# Patient Record
Sex: Male | Born: 1950 | Race: White | Hispanic: No | Smoking: Former smoker
Health system: Southern US, Community
[De-identification: ages and names within clinical notes are randomized; demographics above are authoritative.]

## PROBLEM LIST (undated history)

## (undated) DIAGNOSIS — I10 Essential (primary) hypertension: Secondary | ICD-10-CM

## (undated) DIAGNOSIS — I509 Heart failure, unspecified: Secondary | ICD-10-CM

## (undated) DIAGNOSIS — Z86711 Personal history of pulmonary embolism: Secondary | ICD-10-CM

## (undated) DIAGNOSIS — Z8614 Personal history of Methicillin resistant Staphylococcus aureus infection: Secondary | ICD-10-CM

## (undated) DIAGNOSIS — Z8673 Personal history of transient ischemic attack (TIA), and cerebral infarction without residual deficits: Secondary | ICD-10-CM

## (undated) DIAGNOSIS — Z7401 Bed confinement status: Secondary | ICD-10-CM

## (undated) DIAGNOSIS — E2740 Unspecified adrenocortical insufficiency: Secondary | ICD-10-CM

## (undated) DIAGNOSIS — F0390 Unspecified dementia without behavioral disturbance: Secondary | ICD-10-CM

## (undated) DIAGNOSIS — M069 Rheumatoid arthritis, unspecified: Secondary | ICD-10-CM

## (undated) DIAGNOSIS — Z87898 Personal history of other specified conditions: Secondary | ICD-10-CM

## (undated) DIAGNOSIS — M8580 Other specified disorders of bone density and structure, unspecified site: Secondary | ICD-10-CM

## (undated) DIAGNOSIS — I2510 Atherosclerotic heart disease of native coronary artery without angina pectoris: Secondary | ICD-10-CM

## (undated) DIAGNOSIS — N390 Urinary tract infection, site not specified: Secondary | ICD-10-CM

## (undated) DIAGNOSIS — N179 Acute kidney failure, unspecified: Secondary | ICD-10-CM

## (undated) DIAGNOSIS — G629 Polyneuropathy, unspecified: Secondary | ICD-10-CM

## (undated) HISTORY — DX: Urinary tract infection, site not specified: N39.0

## (undated) HISTORY — DX: Essential (primary) hypertension: I10

## (undated) HISTORY — DX: Rheumatoid arthritis, unspecified: M06.9

## (undated) HISTORY — DX: Personal history of other specified conditions: Z87.898

## (undated) HISTORY — DX: Unspecified adrenocortical insufficiency: E27.40

## (undated) HISTORY — DX: Unspecified dementia, unspecified severity, without behavioral disturbance, psychotic disturbance, mood disturbance, and anxiety: F03.90

## (undated) HISTORY — DX: Personal history of Methicillin resistant Staphylococcus aureus infection: Z86.14

## (undated) HISTORY — DX: Heart failure, unspecified: I50.9

## (undated) HISTORY — DX: Atherosclerotic heart disease of native coronary artery without angina pectoris: I25.10

## (undated) HISTORY — DX: Polyneuropathy, unspecified: G62.9

## (undated) HISTORY — DX: Bed confinement status: Z74.01

## (undated) HISTORY — DX: Other specified disorders of bone density and structure, unspecified site: M85.80

## (undated) HISTORY — DX: Acute kidney failure, unspecified: N17.9

## (undated) HISTORY — DX: Personal history of pulmonary embolism: Z86.711

## (undated) HISTORY — DX: Personal history of transient ischemic attack (TIA), and cerebral infarction without residual deficits: Z86.73

---

## 2008-01-05 HISTORY — PX: CORONARY ARTERY BYPASS GRAFT: SHX141

## 2020-06-11 ENCOUNTER — Other Ambulatory Visit (HOSPITAL_COMMUNITY): Payer: Self-pay | Admitting: Orthopedic Surgery

## 2020-06-11 DIAGNOSIS — M8008XA Age-related osteoporosis with current pathological fracture, vertebra(e), initial encounter for fracture: Secondary | ICD-10-CM

## 2020-06-13 ENCOUNTER — Ambulatory Visit (HOSPITAL_COMMUNITY)
Admission: RE | Admit: 2020-06-13 | Discharge: 2020-06-13 | Disposition: A | Discharge status: Home / Self Care | Payer: Medicare HMO | Source: Ambulatory Visit | Attending: Orthopedic Surgery | Admitting: Orthopedic Surgery

## 2020-06-13 ENCOUNTER — Other Ambulatory Visit: Payer: Self-pay

## 2020-06-13 DIAGNOSIS — M8008XA Age-related osteoporosis with current pathological fracture, vertebra(e), initial encounter for fracture: Secondary | ICD-10-CM | POA: Diagnosis not present

## 2022-01-12 ENCOUNTER — Telehealth (HOSPITAL_COMMUNITY): Payer: Self-pay

## 2022-01-18 ENCOUNTER — Other Ambulatory Visit (HOSPITAL_COMMUNITY): Payer: Self-pay | Admitting: Dentistry

## 2022-02-23 DIAGNOSIS — Z95828 Presence of other vascular implants and grafts: Secondary | ICD-10-CM

## 2022-02-23 HISTORY — DX: Presence of other vascular implants and grafts: Z95.828

## 2022-03-04 ENCOUNTER — Encounter: Payer: Self-pay | Admitting: Family Medicine

## 2022-03-04 ENCOUNTER — Non-Acute Institutional Stay: Payer: Medicare HMO | Admitting: Family Medicine

## 2022-03-04 ENCOUNTER — Other Ambulatory Visit: Payer: Self-pay | Admitting: Family Medicine

## 2022-03-04 VITALS — BP 110/60 | HR 106 | Temp 99.5°F | Resp 23

## 2022-03-04 DIAGNOSIS — Z7401 Bed confinement status: Secondary | ICD-10-CM

## 2022-03-04 DIAGNOSIS — R6889 Other general symptoms and signs: Secondary | ICD-10-CM

## 2022-03-04 DIAGNOSIS — Z8673 Personal history of transient ischemic attack (TIA), and cerebral infarction without residual deficits: Secondary | ICD-10-CM

## 2022-03-04 DIAGNOSIS — I10 Essential (primary) hypertension: Secondary | ICD-10-CM

## 2022-03-04 DIAGNOSIS — F322 Major depressive disorder, single episode, severe without psychotic features: Secondary | ICD-10-CM

## 2022-03-04 DIAGNOSIS — E2740 Unspecified adrenocortical insufficiency: Secondary | ICD-10-CM

## 2022-03-04 DIAGNOSIS — Z8619 Personal history of other infectious and parasitic diseases: Secondary | ICD-10-CM

## 2022-03-04 DIAGNOSIS — E440 Moderate protein-calorie malnutrition: Secondary | ICD-10-CM

## 2022-03-04 DIAGNOSIS — K683 Retroperitoneal hematoma: Secondary | ICD-10-CM

## 2022-03-04 DIAGNOSIS — Z87898 Personal history of other specified conditions: Secondary | ICD-10-CM

## 2022-03-04 NOTE — Progress Notes (Signed)
[facility] Consult Note Telephone: [phone]  Fax: [phone]   Date of encounter: 03/04/22 09:20 AM PATIENT NAME: Douglas Arroyo [address] [place] [address]   [phone] (home)  DOB: [birth year] MRN: [id number] PRIMARY CARE PROVIDER:    Javier Glazier, MD,  [place] [address] [phone]  REFERRING PROVIDER:   Javier Glazier, MD [address] [place],  [place] [address] [phone]  Health Care Agent:    Contact Information     Name Relation Home Work Mobile   Douglas Arroyo Daughter   [phone]        I met face to face with patient in [facility] facility.  Spoke with pt's daughter Douglas Arroyo by phone in multidisciplinary conference. Palliative Care was asked to follow this patient by consultation request of Douglas Glazier, MD to address advance care planning and complex medical decision making. This is an initial visit.  ADVANCE CARE PLANNING/GOALS OF CARE: Health care surrogate gave her permission to discuss.   PRESENT FOR DISCUSSION: daughter, Douglas Arroyo.  ACC provider-Douglas Sanks FNP-C, Douglas Arroyo multidisciplinary team including nurse and social worker  GOALS: Maintain maximal function and nutrition with comfort.  BARRIERS: Possible understanding of progression of disease process and normalcy of declining food and fluid intake towards end of life. Patient with depressive symptoms which may be impacting intake.   Our advance care planning conversation included a discussion about:    The value and importance of advance care planning  Discussed that poor intake of food and fluids along with recent bleeding, new onset of elevated temperature and tachycardia, tachypnea with some decline in O2 sat could indicate recurrent or unresolved infection or bleeding to see how aggressively she wanted to treat pt and if she wanted pt to return to hospital versus investigating hospice referral.   She advised that pt had tube feeding during hospitalization and she is interested to identify the source of his symptoms, possibly re-investigate tube feeding if needed. Advised that although appetite stimulant can be ordered Discussed current presentation and discussion with daughter with facility NP-Douglas Arroyo.  Advised psych evaluation with depressive symptoms.  He is currently on Prednisone for RA  and Remeron which should  help with appetite stimulation. She will order labs and follow up on patient. Review of an existing advance directive document-DNR. Daughter does wish DNR to remain in place.  CODE STATUS: DNR  I spent 30  minutes providing this consultation with more than 50% of the time spent on counseling patient and coordinating communication with referring provider, staff/family, chart review and documentation. -------------------------------------- Section below based on medical decision making (MDM).  ASSESSMENT/PLAN: Hx of septic shock: Currently with low grade temp, tachycardia and tachypnea but with normal BP-argues against shock, question if either new infection or unresolved infection. Work up per facility NP. Currently daughter wants to continue to address potential fixable causes. DNR. Question of possible oncologic process given recurrent PE while on anticoagulation, elevation of LFTs (although both could also be related to sepsis process).  2. Severe major depression/total self care deficit Recommend psychiatric evaluation and treatment. Continue Remeron 15 mg QHS. Was getting OOB in December, now significantly weaker since most recent illness and bleeding.  Has not wanted to attempt getting OOB. Needs help with feeding but not interested in eating.  3.  Malnutrition of moderate degree Agree with Ensure TID Last Albumin low at 2.7. Poor food and fluid intake. Question how much is illness burden versus  depressive symptoms Has not significantly improved with addition of  Remeron or with continued Prednisone. Question if progressing towards end of life behaviors.  4.  Adrenal insufficiency Blood sugars and blood pressures have stabilized with addition of Fludrocortisone daily, continue med.  5.  Retroperitoneal hematoma Significant ecchymosis.  Current low normal BP argues against significant bleeding as cause of tachycardia and tachypnea. Question if possible DIC process in hospital with significant re-bleeds and hemorrhages even after stopping anticoagulation as well as a secondary from PE/sepsis-unable to see if PT/INR or d-dimer obtained.  6. Bedbound Generalized weakness and residual post stroke hemiplegia. Recommend addition of low air loss pressure mattress to prevent skin breakdown over bony prominences. Turn q 2-3 hours while awake.   HISTORY OF PRESENT ILLNESS: Douglas Arroyo is a 72 y.o. year old male with Adrenal insufficiency, recent septic shock, PE, UTI and failure to thrive.  He has had multiple hospitalizations at [facility] since November of 2023 when he was initially treated for a MRSA + UTI. He was treated with IC Ceftriaxone and d/c'd on oral Nitrofurantoin.  He has a prior hx of DVT/PE 2020 and was on Eliquis.  In January (01/23/22) he was inpatient for UTI treated with Cefepime and Vancomycin. Plan was to d/c with foley, given he was admitted from SNF with foley. He does have a hx of RA and was on Plaquenil 200 mg BID and Prednisone 5 mg BID. Has chronic back pain with hx of old T12, L1 fracture for which he was taking Norco 5-325 mg nightly and Q 6 hrs prn.  He was continued on Eliquis 5 mg BID.  He developed a PE on 01/26/22 and noted to be in shock with profound hypotension and multiple episodes of hypoglycemia in non diabetic patient.  He was started on anticoagulation and developed issues with bleeding.  Multiple attempts were made to hold anticoagulants and as soon as these were restarted the bleeding resumed. He was subsequently noted to have  Adrenal insufficiency, cellulitis and UTI.  He had to have IVC filter placed and had to have dialysis due to episode of AKI with Cr 3.41, BUN 51. Calcium was low at 8.3, Phos at 1.5 with elevated AST 61 and alk phos of 228.  On 02/08/22 his Bps were documented as 60s/30s to 90s/60s with O2 sats in low 80s.  Prior to d/c BP in the 120/60s range with O2 sats 96-98%. Pt offers no spontaneous conversation, primarily answers yes or no.  States no appetite. Denies pain, SOB or cough.  Pt was bedbound prior to most recent hospitalization.  Endorses feeling down, depressed and hopeless but does not elaborate further.    Labs done at facility 02/25/22: CMP remarkable for high CL 112, alkaline phos 214.  Low Ca 8.5, Albumin 2.7. CBC remarkable for low HGB 8.2, HCT 27.6%.  Normal 294   ACTIVITIES OF DAILY LIVING: CONTINENT OF BLADDER? No CONTINENT OF BOWEL? No BATHING/DRESSING/FEEDING-needs assistance  MOBILITY:   AMBULATORY?  No, bedbound per staff since December 2023   BEDBOUND? Yes  APPETITE? Poor, mainly drinking part of 1 Ensure daily  CURRENT PROBLEM LIST:  Patient Active Problem List   Diagnosis Date Noted   Total self-care deficit 03/04/2022   History of stroke with right sided hemiplegia 03/04/2022   Hx of shock 01/26/22 03/04/2022   Hypertension 03/04/2022   Bedbound 03/04/2022   Adrenal insufficiency ([place]) 03/04/2022   Retroperitoneal hematoma 03/04/2022   PAST MEDICAL HISTORY:  Active Ambulatory Problems  Diagnosis Date Noted   Total self-care deficit 03/04/2022   History of stroke with right sided hemiplegia 03/04/2022   Hx of shock 01/26/22 03/04/2022   Hypertension 03/04/2022   Bedbound 03/04/2022   Adrenal insufficiency ([place]) 03/04/2022   Retroperitoneal hematoma 03/04/2022   Resolved Ambulatory Problems    Diagnosis Date Noted   No Resolved Ambulatory Problems   Past Medical History:  Diagnosis Date   AKI (acute kidney injury) ([place])    CAD (coronary artery  disease)    CHF (congestive heart failure) (HCC)    Dementia ([place])    History of pulmonary embolism 01/26/22    Neuropathy    Osteopenia    Personal history of MRSA (methicillin resistant Staphylococcus aureus)    Polyneuropathy    Rheumatoid arthritis ([place])    S/P IVC filter 02/23/2022   UTI (urinary tract infection)    SOCIAL HX:  Social History   Tobacco Use   Smoking status: Not on file   Smokeless tobacco: Former  Substance Use Topics   Alcohol use: Not Currently   FAMILY HX: No family history on file.     Preferred Pharmacy: [facility]:  Allergies  Allergen Reactions   Eliquis [Apixaban] Other (See Comments)    Hemorrhage   Enbrel [Etanercept]    Erythromycin      PERTINENT MEDICATIONS:  Outpatient Encounter Medications as of 03/04/2022  Medication Sig   donepezil (ARICEPT) 5 MG tablet Take 5 mg by mouth at bedtime.   fludrocortisone (FLORINEF) 0.1 MG tablet Take 0.1 mg by mouth daily. Daily at 0900 am   gabapentin (NEURONTIN) 100 MG capsule Take 100 mg by mouth 3 (three) times daily.   hydroxychloroquine (PLAQUENIL) 200 MG tablet Take 200 mg by mouth 2 (two) times daily.   ipratropium-albuterol (DUONEB) 0.5-2.5 (3) MG/3ML SOLN Take 3 mLs by nebulization every 6 (six) hours as needed. Shortness of breath, wheezing   MELATONIN PO Take 5 mg by mouth at bedtime.   mirtazapine (REMERON) 15 MG tablet Take 15 mg by mouth at bedtime.   predniSONE (DELTASONE) 5 MG tablet Take 10 mg by mouth 2 (two) times daily with a meal.   [DISCONTINUED] acetaminophen (TYLENOL) 325 MG tablet Take 650 mg by mouth every 4 (four) hours as needed for mild pain or fever.   No facility-administered encounter medications on file as of 03/04/2022.    History obtained from review of EMR, discussion with primary team, and interview with family, facility staff and/or patient.   01/22/22: CMP remarkable for low:  Na 130, CL 96, CO2 20, Ca 8.4, total protein 5.8, Albumin 2.9. High:  Total Bili 1.3,  Alk Phos 116. eGFR normal at 75, Cr at 1.07.  CBC: Unremarkable except for elevated abs monocyte and eosinophils.  Normal:  WBC 9.6, HGB 15.7, HCT 46.2%, PLT 246.  SARS-CoV-2, Influenza A & B were all negative Elevated lactic acid 2.4 CXR low lung volumes. No acute cardiopulmonary findings.  UA:  Spec grav 1.026, Protein 100, glucose 30, leuk esterase 25, RBCs 13/hpf  02/23/22 Labs at [facility]: BMP with normal Cr 1.16, K+ 3.8, Phos 3.1, Na 142. Low Calcium 8.2.  Elevated CL 112, BUN 29  CTA 02/09/22: Left retroperitoneal hematoma, s/p embolization of left lumbar and iliolumbar arteries.  Transfused with 3 units PRBCs and 1 unit whole blood with HGB stable at 8.2.  Eliquis permanently d/c'd due to recurrent bleeds.  AKI-Cr peaked at 4.46, required IHD during hospitalization to return to baseline function.  ST evaluation showed  no clear source of dysphagia but reported 4 month hx of sporadic coughing and choking with oral intake. Short term [place] placement with tube feedings inpatient. Psych consult thought related to vascular dementia and adjustment disorder with depressed mood. Discussed with pt and daughter who are against PEG.  CT head code stroke during admission for slurred speech and left pupil dilatation was unremarkable but suspected toxicity to Cefepime.  I reviewed available labs, medications, imaging, studies and related documents from the EMR. Records reviewed and summarized above.   Physical Exam: weight 234 lbs on 02/24/22 at hospital GENERAL: NAD, appears fatigued LUNGS: CTAB, no increased work of breathing, mild tachypnea, room air CARDIAC:  S1S2, Intermittently IRIR with slightly rapid rate 106 with no MRG, No edema, No  cyanosis ABD:  Hypo-active BS x 4 quads, soft, non-tender EXTREMITIES: Right sided hemiplegia, generalized debility, No muscle atrophy/subcutaneous fat loss Heme: Widespread dark purple ecchymosis of right arm circumferentially from just inferior to  shoulder to hand, right side of neck and submandibular area and upper chest inferior to right clavicle.  Fading red-purple ecchymosis/hematoma of low back and left flank. Palpable but soft, non-tender per pt.  NEURO:  Noted generalized  weakness, noted cognitive impairment, question aphasia-expressive/receptive versus choosing not to speak.  PERRL. No facial drooping. PSYCH:  non-anxious, flat affect, A & O to self.  Poor eye contact, gives one word answers, doesn't volunteer speech  Thank you for the opportunity to participate in the care of Douglas Arroyo. Please call our main office at [phone] if we can be of additional assistance.    Damaris Hippo FNP-C  [email or web address] [facility]  Phone:  [phone]

## 2022-03-06 ENCOUNTER — Encounter: Payer: Self-pay | Admitting: Family Medicine

## 2022-03-06 DIAGNOSIS — Z8619 Personal history of other infectious and parasitic diseases: Secondary | ICD-10-CM | POA: Insufficient documentation

## 2022-03-06 DIAGNOSIS — E440 Moderate protein-calorie malnutrition: Secondary | ICD-10-CM | POA: Insufficient documentation

## 2022-03-06 DIAGNOSIS — F322 Major depressive disorder, single episode, severe without psychotic features: Secondary | ICD-10-CM | POA: Insufficient documentation

## 2022-03-18 ENCOUNTER — Non-Acute Institutional Stay: Payer: Medicare HMO | Admitting: Family Medicine

## 2022-03-18 ENCOUNTER — Encounter: Payer: Self-pay | Admitting: Family Medicine

## 2022-03-18 VITALS — BP 98/74 | HR 106 | Temp 97.3°F | Resp 18

## 2022-03-18 DIAGNOSIS — R5381 Other malaise: Secondary | ICD-10-CM

## 2022-03-18 DIAGNOSIS — R609 Edema, unspecified: Secondary | ICD-10-CM | POA: Insufficient documentation

## 2022-03-18 DIAGNOSIS — E440 Moderate protein-calorie malnutrition: Secondary | ICD-10-CM

## 2022-03-18 DIAGNOSIS — F322 Major depressive disorder, single episode, severe without psychotic features: Secondary | ICD-10-CM

## 2022-03-18 NOTE — Progress Notes (Signed)
[facility] Consult Note Telephone: [phone]  Fax: [phone]   Date of encounter: 03/18/22 09:20 AM PATIENT NAME: Douglas Arroyo [address] [place] [address]   [phone] (home)  DOB: August 04, 1950 MRN: [id number] PRIMARY CARE PROVIDER:    Javier Glazier, MD,  [place] [address] [phone]  REFERRING PROVIDER:   Javier Glazier, MD [address] [place],   [address] [phone]  Health Care Agent:    Contact Information     Name Relation Home Work Mobile   Lane,[name] Daughter   [phone]        I met face to face with patient in [facility] facility.  Spoke with pt's daughter [name] by phone following visit. Palliative Care was asked to follow this patient by consultation request of Javier Glazier, MD to address advance care planning and complex medical decision making. This is a follow up visit.   CODE STATUS: DNR   Visit based on medical decision making (MDM).  ASSESSMENT/PLAN: 1. Severe major depression/total self care deficit Increase Sertraline from 25 to 50 mg daily. Encourage pt to get OOB TID with meals. Offer choices instead of asking: EX-Do you want to get up before or after breakfast?  2.  Malnutrition of moderate degree Agree with Ensure TID Albumin improved to 3.0  3.   RLE localized edema Discussed with facility PA and daughter. Possible DVT but pt unable to tolerate anticoagulation with significant bleeding, has IVC filter. Reposition Q 2 hrs while awake and elevate LE while in bed. OOB TID.  4.  Debility Recommend coordinated effort to facilitate pt getting OOB at least TID for meals by having PT, daughter, nurse Alda Berthold and provider get pt up to take a shower and make his bed.    HISTORY OF PRESENT ILLNESS: Douglas Arroyo is a 72 y.o. year old male with Adrenal insufficiency, recent septic shock, PE, UTI and failure to thrive.  He has had multiple  hospitalizations at [facility] since November of 2023 when he was initially treated for a MRSA + UTI. He has a prior hx of DVT/PE 2020 and was on Eliquis.  In January (01/23/22) he was inpatient for UTI treated with Cefepime and Vancomycin. He was taken off Aricept for unknown reason.  He denies pain, SOB or chest pain.  Acknowledges some itching. Limited conversation but answers questions appropriately with head nods and much prompting. Unable to state date or time, but does provide limited answers to personal history. Patient states that he is not turning routinely and has not been bathed recently.  Spoke with daughter [name] and nursing facility staff who state pt has been refusing to get OOB or shower.  [name] says Alda Berthold is the nurse who has been successful in getting pt into shower once weekly. Refused to continue working with PT. He had been transferring himself at one point and re-injured his back so pain management specialist indicated no further independent transfer. Pt is very afraid when staff using lift to get him OOB and has since refused.  Daughter states pt had a cat who he loved and they were unable to keep, live-in girlfriend of 21 years took care of pt for first year post stroke and then left him stating she couldn't do it any more. He has thus suffered multiple losses. Spoke with [place] the PA for facility and advised of RLE discrepancy in edema with question of possible DVT but since no anticoagulation is possible,  will continue to monitor and reposition for comfort. Discussed increasing recent Zoloft from 25 to 50 mg daily and she was in agreement. He likes to eat baked goods and doing coffee but he has showed no interest recently. She had tried multiple things to get him out of the bed including a motorized wheelchair but he refuses to use it.   Labs done at facility 03/04/22: CMP remarkable for high  alkaline phos 176.  Low  Albumin 3.0. CBC remarkable for low HGB 10.3, HCT 32.7%.   WBC normal  at 10.1 with left shift elevated ANC 7858.  ACTIVITIES OF DAILY LIVING: CONTINENT OF BLADDER/BOWEL? Yes with incontinent episodes   BATHING/DRESSING/FEEDING-needs assistance  MOBILITY:   AMBULATORY?  No, bedbound per staff since December 2023 , able to assist with bed mobility   BEDBOUND? Yes  APPETITE? Poor, staff report only intaking Ensure and OJ   CURRENT PROBLEM LIST:  Patient Active Problem List   Diagnosis Date Noted   Hx of septic shock 03/06/2022   Depression, major, single episode, severe ([place]) 03/06/2022   Malnutrition of moderate degree ([place]) 03/06/2022   Total self-care deficit 03/04/2022   History of stroke with right sided hemiplegia 03/04/2022   Hx of shock 01/26/22 03/04/2022   Hypertension 03/04/2022   Bedbound 03/04/2022   Adrenal insufficiency ([place]) 03/04/2022   Retroperitoneal hematoma 03/04/2022   PAST MEDICAL HISTORY:  Active Ambulatory Problems    Diagnosis Date Noted   Total self-care deficit 03/04/2022   History of stroke with right sided hemiplegia 03/04/2022   Hx of shock 01/26/22 03/04/2022   Hypertension 03/04/2022   Bedbound 03/04/2022   Adrenal insufficiency ([place]) 03/04/2022   Retroperitoneal hematoma 03/04/2022   Hx of septic shock 03/06/2022   Depression, major, single episode, severe ([place]) 03/06/2022   Malnutrition of moderate degree ([place]) 03/06/2022   Resolved Ambulatory Problems    Diagnosis Date Noted   No Resolved Ambulatory Problems   Past Medical History:  Diagnosis Date   AKI (acute kidney injury) ([place])    CAD (coronary artery disease)    CHF (congestive heart failure) (HCC)    Dementia ([place])    History of pulmonary embolism 01/26/22    Neuropathy    Osteopenia    Personal history of MRSA (methicillin resistant Staphylococcus aureus)    Polyneuropathy    Rheumatoid arthritis ([place])    S/P IVC filter 02/23/2022   UTI (urinary tract infection)      Preferred Pharmacy: [facility]:  Allergies  Allergen Reactions    Eliquis [Apixaban] Other (See Comments)    Hemorrhage   Enbrel [Etanercept]    Erythromycin      PERTINENT MEDICATIONS:  Outpatient Encounter Medications as of 03/18/2022  Medication Sig   donepezil (ARICEPT) 5 MG tablet Take 5 mg by mouth at bedtime.   fludrocortisone (FLORINEF) 0.1 MG tablet Take 0.1 mg by mouth daily. Daily at 0900 am   gabapentin (NEURONTIN) 100 MG capsule Take 100 mg by mouth 3 (three) times daily.   hydroxychloroquine (PLAQUENIL) 200 MG tablet Take 200 mg by mouth 2 (two) times daily.   ipratropium-albuterol (DUONEB) 0.5-2.5 (3) MG/3ML SOLN Take 3 mLs by nebulization every 6 (six) hours as needed. Shortness of breath, wheezing   MELATONIN PO Take 5 mg by mouth at bedtime.   mirtazapine (REMERON) 15 MG tablet Take 15 mg by mouth at bedtime.   predniSONE (DELTASONE) 5 MG tablet Take 10 mg by mouth 2 (two) times daily with a meal.   No facility-administered  encounter medications on file as of 03/18/2022.    History obtained from review of EMR, discussion with primary team, and interview with family, facility staff and/or patient.  I reviewed available labs, medications, imaging, studies and related documents from the EMR. Records reviewed and summarized above.   Physical Exam: weight 234 lbs on 02/24/22 at hospital GENERAL: NAD, appears disheveled and fatigued LUNGS: CTAB, no increased work of breathing, room air CARDIAC:  S1S2 RRR with slightly rapid rate 104 with no MRG, +3 Edema to RLE with no palpable cord to calf, trace +1 to LLE, No  cyanosis ABD:  Hypo-active BS x 4 quads, firm, non-tender EXTREMITIES: Right sided hemiplegia with ecchymosis from wrist to bicep, generalized debility, RFA muscle atrophy/subcutaneous fat loss Skin: Widespread ecchymosis much improved from previous visit majority resolved at this time. Scattered scratches/lesions with scant serous weeping present to R Flank into groin suspected maceration. Skin extremely dry. Blanchable redness  present to sacrum with barrier cream in place.  NEURO:  Noted generalized  weakness, noted cognitive impairment. With prompting patient able to verbalize but choosing not to speak. No facial drooping. PSYCH:  non-anxious, flat affect, A & O to self.  Poor eye contact, gives one word answers, doesn't volunteer speech  Thank you for the opportunity to participate in the care of [employer]. Please call our main office at [phone] if we can be of additional assistance.    Damaris Hippo FNP-C  [email or web address]  Phone:  [phone]

## 2022-03-22 ENCOUNTER — Encounter: Payer: Self-pay | Admitting: Family Medicine

## 2022-04-16 ENCOUNTER — Non-Acute Institutional Stay: Payer: Medicare HMO | Admitting: Family Medicine

## 2022-04-16 VITALS — BP 120/70 | HR 79 | Temp 97.1°F | Resp 18

## 2022-04-16 DIAGNOSIS — F322 Major depressive disorder, single episode, severe without psychotic features: Secondary | ICD-10-CM

## 2022-04-16 DIAGNOSIS — R5381 Other malaise: Secondary | ICD-10-CM

## 2022-04-16 DIAGNOSIS — R609 Edema, unspecified: Secondary | ICD-10-CM

## 2022-04-16 NOTE — Progress Notes (Signed)
Therapist, nutritional Palliative Care Consult Note Telephone: [phone]  Fax: [phone]   Date of encounter: 04/16/22 09:20 AM PATIENT NAME: Douglas Arroyo [address] [place] [address]   [phone] (home)  DOB: 02-06-1950 MRN: [id number] PRIMARY CARE PROVIDER:    Nadara Eaton, MD,  [address] [place] [address] [phone]  REFERRING PROVIDER:   Nadara Eaton, MD [address] [place],  [place] [address] [phone]  Health Care Agent:    Contact Information     Name Relation Home Work Mobile   Lane,[name] Daughter   [phone]        I met face to face with patient in [facility] facility.  Spoke with pt's daughter Leotis Shames by phone following visit. Palliative Care was asked to follow this patient by consultation request of Nadara Eaton, MD to address advance care planning and complex medical decision making. This is a follow up visit.   CODE STATUS: DNR   Visit based on medical decision making (MDM).  ASSESSMENT/PLAN: 1. Severe major depression/total self care deficit Continue Sertraline 50 mg daily with some improvement in affect since dose increase. Encourage pt to get OOB   2.   RLE localized edema Unchanged but intolerant of anticoagulation with significant bleeding, IVC filter in place Continue to encourage pt to move and get OOB, elevate LE while in bed.  3.    Debility No change with pt uncooperative with getting OOB  Reinforced negative consequences of remaining bedbound for infection, deterioration of strength/function/ROM and skin integrity.   Chief Complaint: Palliative Care is continuing to follow pt for chronic medical management in setting of adrenal insufficiency with multiple admissions for PE, Sepsis and UTI in setting of dementia.   HISTORY OF PRESENT ILLNESS: Douglas Arroyo is a 72 y.o. year old male with Adrenal insufficiency, recent septic shock, PE, UTI and failure to thrive.  He has had  multiple hospitalizations at [facility] since November of 2023 when he was initially treated for a MRSA + UTI.  He continues to deny pain, SOB or chest pain.  Nursing facility staff state pt continues to refuse to get OOB.  Spoke with patient and he was slightly more interactive in having a conversation than previously when he primarily nodded or spoke only with much prompting.  He cannot give an explanation for why he won't get OOB or attempt to work with PT.  Have explained that this will lead to further deterioration in his condition putting him at risk of infection or breakdown.  He states his daughter is coming in for a visit and he has promised he will get OOB when his daughter comes.     ACTIVITIES OF DAILY LIVING: CONTINENT OF BLADDER/BOWEL? Yes with incontinent episodes   BATHING/DRESSING/FEEDING-needs assistance with all except feeding  MOBILITY:   AMBULATORY?  No, bedbound per staff since December 2023, able to assist with bed mobility   BEDBOUND? Yes  APPETITE? Remains poor  CURRENT PROBLEM LIST:  Patient Active Problem List   Diagnosis Date Noted   Edema 03/18/2022   Physical debility 03/18/2022   Hx of septic shock 03/06/2022   Depression, major, single episode, severe 03/06/2022   Malnutrition of moderate degree 03/06/2022   Total self-care deficit 03/04/2022   History of stroke with right sided hemiplegia 03/04/2022   Hx of shock 01/26/22 03/04/2022   Hypertension 03/04/2022   Bedbound 03/04/2022   Adrenal insufficiency 03/04/2022   Retroperitoneal hematoma 03/04/2022   PAST MEDICAL  HISTORY:  Active Ambulatory Problems    Diagnosis Date Noted   Total self-care deficit 03/04/2022   History of stroke with right sided hemiplegia 03/04/2022   Hx of shock 01/26/22 03/04/2022   Hypertension 03/04/2022   Bedbound 03/04/2022   Adrenal insufficiency 03/04/2022   Retroperitoneal hematoma 03/04/2022   Hx of septic shock 03/06/2022   Depression, major, single episode, severe  03/06/2022   Malnutrition of moderate degree 03/06/2022   Edema 03/18/2022   Physical debility 03/18/2022   Resolved Ambulatory Problems    Diagnosis Date Noted   No Resolved Ambulatory Problems   Past Medical History:  Diagnosis Date   AKI (acute kidney injury) (HCC)    CAD (coronary artery disease)    CHF (congestive heart failure) (HCC)    Dementia (HCC)    History of pulmonary embolism 01/26/22    Neuropathy    Osteopenia    Personal history of MRSA (methicillin resistant Staphylococcus aureus)    Polyneuropathy    Rheumatoid arthritis (HCC)    S/P IVC filter 02/23/2022   UTI (urinary tract infection)      Preferred Pharmacy: ALLERGIES:  Allergies  Allergen Reactions   Eliquis [Apixaban] Other (See Comments)    Hemorrhage   Enbrel [Etanercept]    Erythromycin      PERTINENT MEDICATIONS:  Outpatient Encounter Medications as of 04/16/2022  Medication Sig   fludrocortisone (FLORINEF) 0.1 MG tablet Take 0.1 mg by mouth daily. Daily at 0900 am   gabapentin (NEURONTIN) 100 MG capsule Take 100 mg by mouth 3 (three) times daily.   hydroxychloroquine (PLAQUENIL) 200 MG tablet Take 200 mg by mouth 2 (two) times daily.   ipratropium-albuterol (DUONEB) 0.5-2.5 (3) MG/3ML SOLN Take 3 mLs by nebulization every 6 (six) hours as needed. Shortness of breath, wheezing   MELATONIN PO Take 5 mg by mouth at bedtime.   mirtazapine (REMERON) 15 MG tablet Take 15 mg by mouth at bedtime.   predniSONE (DELTASONE) 5 MG tablet Take 10 mg by mouth 2 (two) times daily with a meal.   No facility-administered encounter medications on file as of 04/16/2022.    History obtained from review of EMR, discussion with facility staff and/or patient.  I reviewed available labs, medications, imaging, studies and related documents from the EMR. There are no new records since prior exam.   Physical Exam:  GEN:  NAD  LUNGS: CTAB, no increased work of breathing, room air CARDIAC:  S1S2 RRR with no MRG, +3  Edema to RLE, trace LLE, No  cyanosis ABD:  Normo-active BS x 4 quads, soft, non-tender EXTREMITIES: Right sided hemiplegia with  generalized debility Skin: Faint ecchymosis of right arm, left arm with scattered smaller ecchymoses. Skin extremely dry on feet. Taut, shiny edema of RLE to above knee NEURO:  Noted generalized  weakness, no noted cognitive impairment PSYCH:  non-anxious, flat affect, A & O times 3.  Makes some eye contact, some volunteered speech  Thank you for the opportunity to participate in the care of [facility]. Please call our main office at [phone] if we can be of additional assistance.    Joycelyn Man FNP-C  [email or web address] .[facility]  Phone:  [phone]

## 2022-05-02 ENCOUNTER — Encounter: Payer: Self-pay | Admitting: Family Medicine

## 2022-08-23 IMAGING — MR MR LUMBAR SPINE W/O CM
5 series · 30 of 48 positions shown · non-contrast
Comparison: None.

CLINICAL DATA: Right-sided weakness

EXAM:
MRI LUMBAR SPINE WITHOUT CONTRAST
TECHNIQUE: Multiplanar, multisequence MR imaging of the lumbar spine was
performed. No intravenous contrast was administered.

[Series 5: T1 · sagittal · 4.0mm · 0.88mm/px · 6 of 17 slices shown (1 of 2)]
[im 1/17]
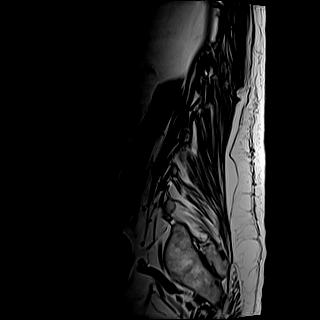
[im 4/17]
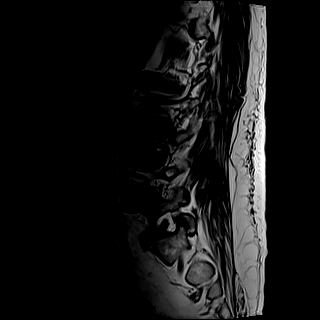
[im 7/17]
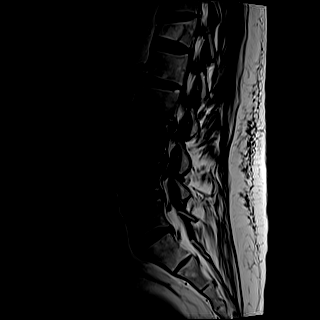
[im 10/17]
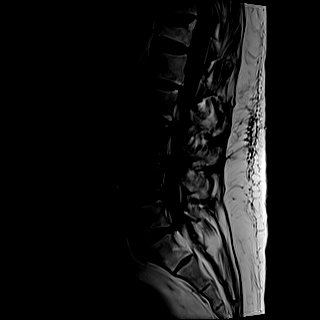
[im 13/17]
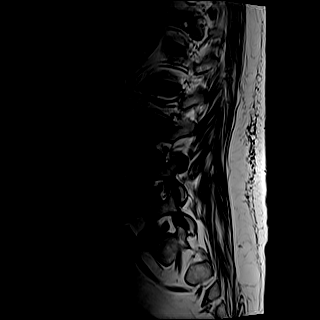
[im 17/17]
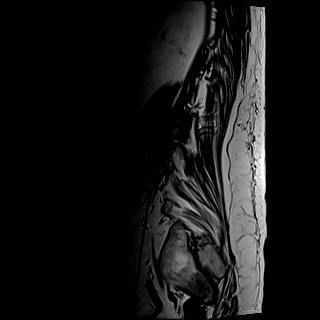

[Series 6: T2 · sagittal · 4.0mm · 0.88mm/px · 7 of 17 slices shown (1 of 2)]
[im 1/17]
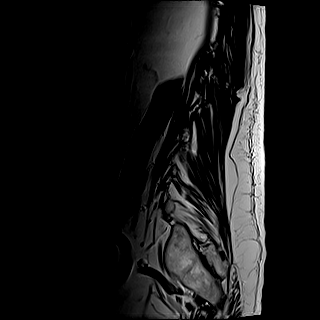
[im 3/17]
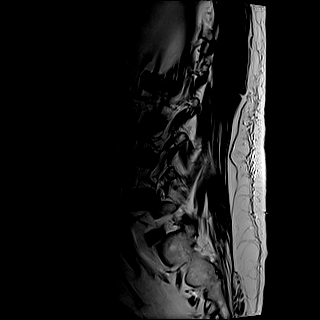
[im 6/17]
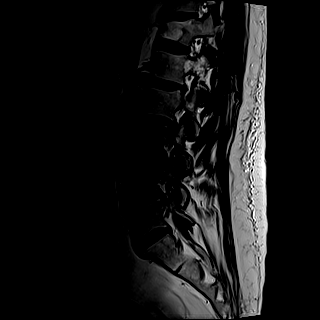
[im 9/17]
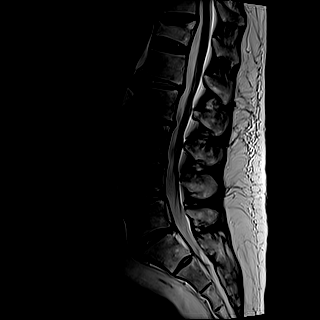
[im 11/17]
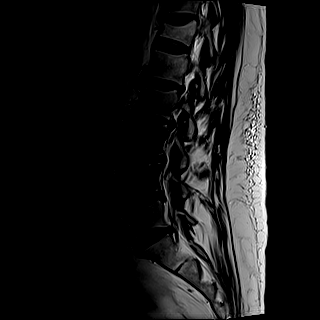
[im 14/17]
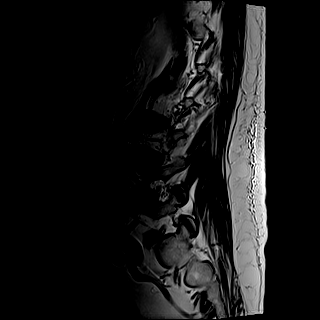
[im 17/17]
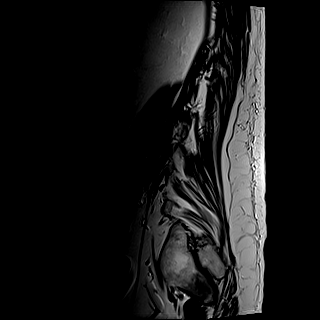

[Series 7: STIR · sagittal · 4.0mm · 0.55mm/px · 1 of 17 slices shown]
[im 1/17]
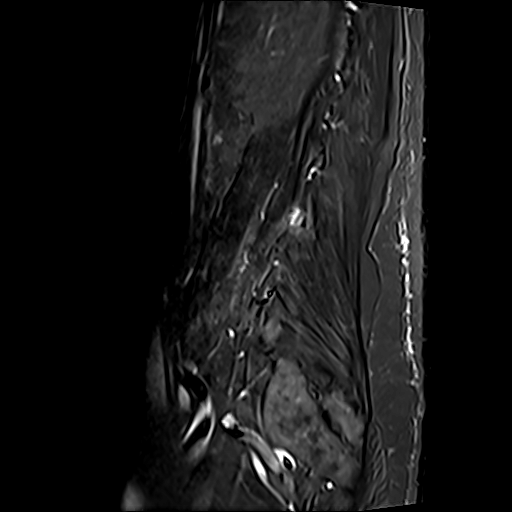

[Series 8: T2 · axial · 4.0mm · 0.62mm/px · z∈[-76,+129]mm · 8 of 37 slices shown (2 of 2)]
[im 1/37]
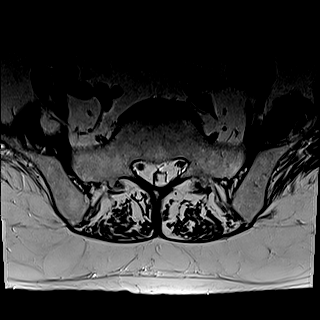
[im 6/37]
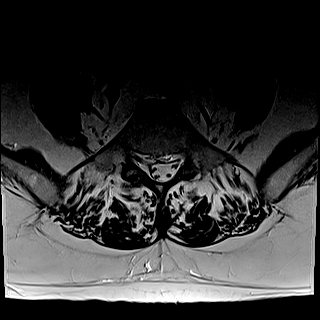
[im 12/37]
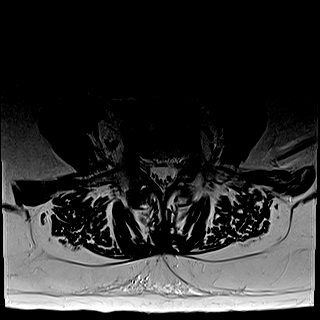
[im 17/37]
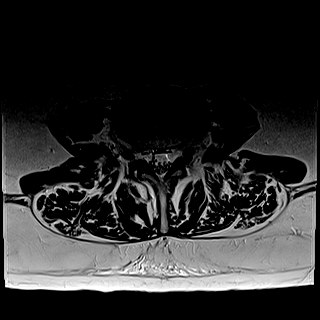
[im 20/37]
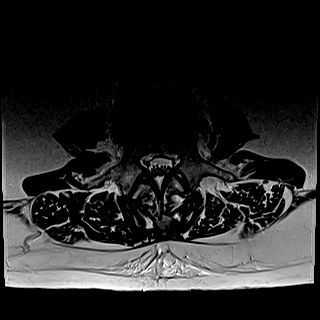
[im 25/37]
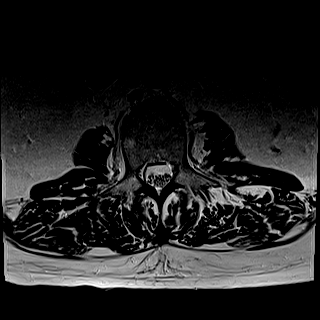
[im 31/37]
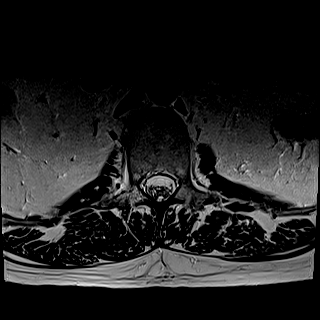
[im 37/37]
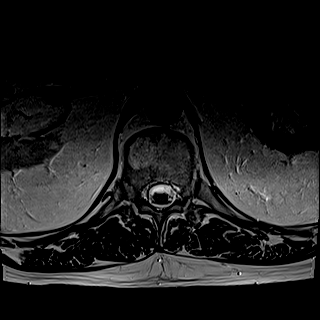

[Series 9: T1 · axial · 4.0mm · 0.39mm/px · z∈[-76,+129]mm · 8 of 37 slices shown (2 of 2)]
[im 1/37]
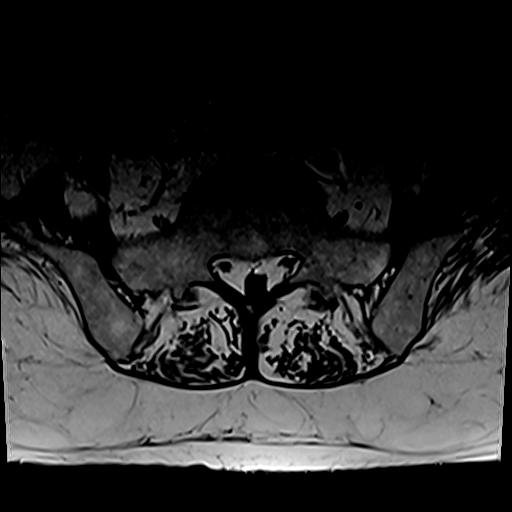
[im 6/37]
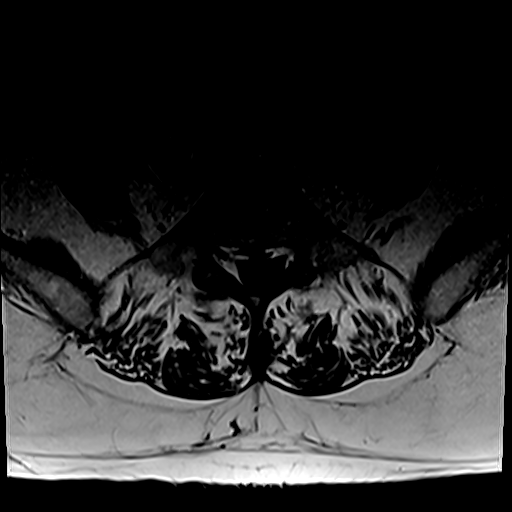
[im 12/37]
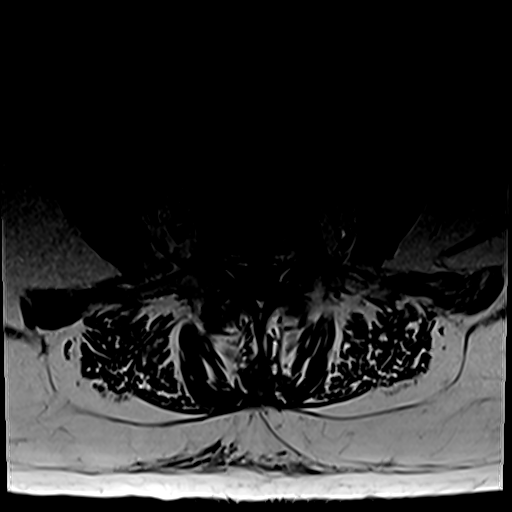
[im 17/37]
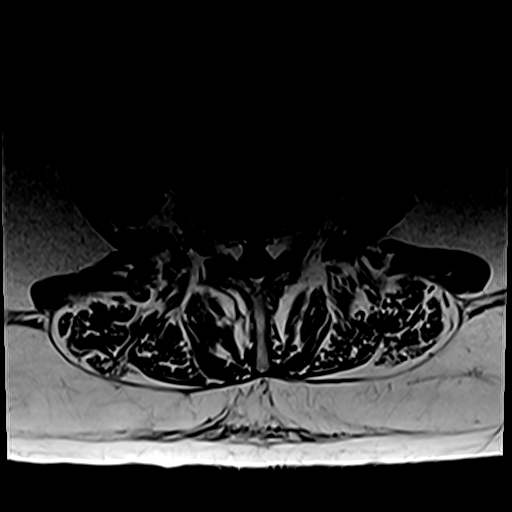
[im 20/37]
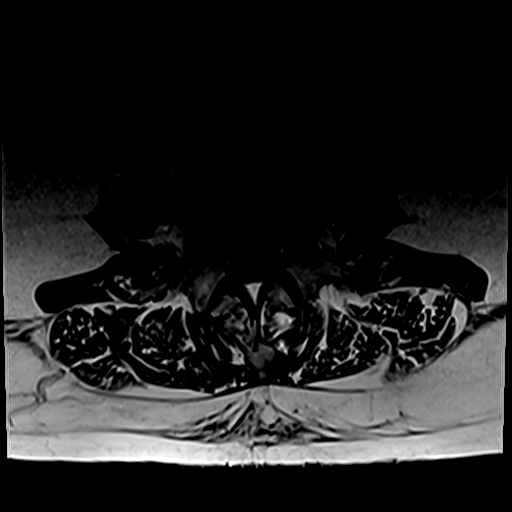
[im 25/37]
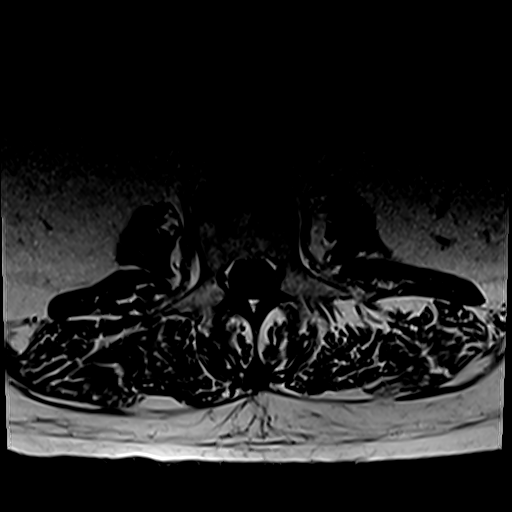
[im 31/37]
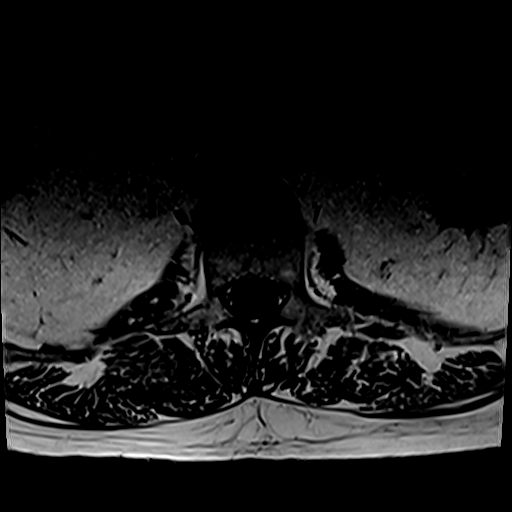
[im 37/37]
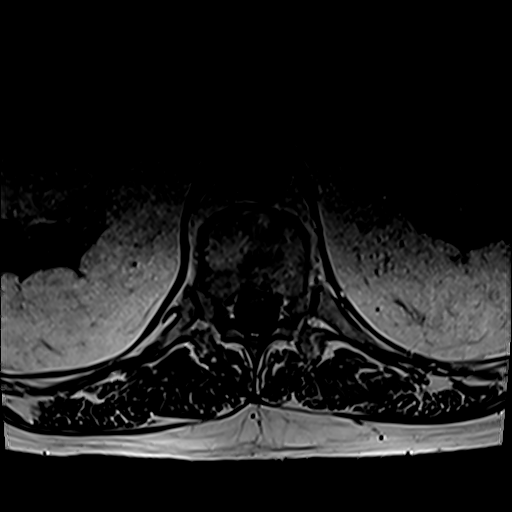

[30 of 48 positions shown; findings below may reference images not displayed]

FINDINGS: Segmentation:  Standard.

Alignment:  Physiologic.

Vertebrae: Acute superior endplate compression fracture of the L3
vertebral body with approximately 30% height loss anteriorly. Bone
marrow edema throughout the vertebral body. No evidence of fracture
extension into the posterior elements. No bony retropulsion.

Subacute to chronic appearing superior endplate compression fracture
of T12 with approximately 50% vertebral body height loss. Mild
marrow edema at the superior endplate. Minimal retropulsion
superiorly. No evidence of fracture extension into the posterior
elements.

Remaining vertebral body heights are maintained. No additional
fractures. No evidence of discitis. No suspicious marrow replacing
bone lesion. Probable intraosseous hemangioma within the anterior
aspect of T12.

Conus medullaris and cauda equina: Conus extends to the L1 level.
Conus and cauda equina appear normal.

Paraspinal and other soft tissues: Negative.

Disc levels:

T12-L1: Negative.

L1-L2: Mild circumferential disc bulge. Unremarkable facet joints.
No foraminal or canal stenosis.

L2-L3: Mild circumferential disc bulge. Mild bilateral facet
arthropathy. Mild canal stenosis and mild bilateral subarticular
recess stenosis. No significant foraminal stenosis.

L3-L4: Mild circumferential disc bulge. Mild bilateral facet
arthropathy. No significant foraminal or canal stenosis.

L4-L5: Mild circumferential disc bulge with small superimposed disc
protrusion, eccentric to the left with slight caudal migration. No
significant foraminal or canal stenosis.

L5-S1: Left paracentral disc protrusion resulting in mild left
subarticular recess stenosis without canal stenosis. No foraminal
stenosis.
IMPRESSION: 1. Acute superior endplate compression fracture of the L3 vertebral
body with approximately 30% height loss anteriorly. No bony
retropulsion.
2. Subacute-to-chronic appearing superior endplate compression
fracture of T12 with approximately 50% vertebral body height loss.
Minimal retropulsion.
3. Mild multilevel degenerative changes of the lumbar spine with
mild canal and bilateral subarticular recess stenosis at L2-L3 and
mild left subarticular recess stenosis at L5-S1.

## 2023-07-05 DEATH — deceased
